# Patient Record
Sex: Female | Born: 1965
Health system: Southern US, Community
[De-identification: ages and names within clinical notes are randomized; demographics above are authoritative.]

## PROBLEM LIST (undated history)

## (undated) DIAGNOSIS — C50919 Malignant neoplasm of unspecified site of unspecified female breast: Secondary | ICD-10-CM

## (undated) DIAGNOSIS — N939 Abnormal uterine and vaginal bleeding, unspecified: Secondary | ICD-10-CM

## (undated) DIAGNOSIS — F419 Anxiety disorder, unspecified: Secondary | ICD-10-CM

## (undated) DIAGNOSIS — J449 Chronic obstructive pulmonary disease, unspecified: Secondary | ICD-10-CM

## (undated) DIAGNOSIS — D649 Anemia, unspecified: Secondary | ICD-10-CM

## (undated) HISTORY — DX: Abnormal uterine and vaginal bleeding, unspecified: N93.9

## (undated) HISTORY — DX: Anxiety disorder, unspecified: F41.9

## (undated) HISTORY — DX: Malignant neoplasm of unspecified site of unspecified female breast: C50.919

## (undated) HISTORY — PX: ABLATION: SHX5711

## (undated) HISTORY — DX: Chronic obstructive pulmonary disease, unspecified: J44.9

## (undated) HISTORY — DX: Anemia, unspecified: D64.9

---

## 1996-05-16 DIAGNOSIS — C50919 Malignant neoplasm of unspecified site of unspecified female breast: Secondary | ICD-10-CM

## 1996-05-16 HISTORY — DX: Malignant neoplasm of unspecified site of unspecified female breast: C50.919

## 1996-05-16 HISTORY — PX: BREAST LUMPECTOMY: SHX2

## 1998-01-26 ENCOUNTER — Other Ambulatory Visit: Admission: RE | Admit: 1998-01-26 | Discharge: 1998-01-26 | Payer: Self-pay | Admitting: Obstetrics and Gynecology

## 1999-02-05 ENCOUNTER — Other Ambulatory Visit: Admission: RE | Admit: 1999-02-05 | Discharge: 1999-02-05 | Payer: Self-pay | Admitting: Obstetrics and Gynecology

## 1999-03-11 ENCOUNTER — Other Ambulatory Visit: Admission: RE | Admit: 1999-03-11 | Discharge: 1999-03-11 | Payer: Self-pay | Admitting: Obstetrics and Gynecology

## 1999-06-18 ENCOUNTER — Other Ambulatory Visit: Admission: RE | Admit: 1999-06-18 | Discharge: 1999-06-18 | Payer: Self-pay | Admitting: Obstetrics and Gynecology

## 1999-07-02 ENCOUNTER — Emergency Department (HOSPITAL_COMMUNITY): Admission: EM | Admit: 1999-07-02 | Discharge: 1999-07-02 | Payer: Self-pay | Admitting: Emergency Medicine

## 1999-07-03 ENCOUNTER — Encounter: Payer: Self-pay | Admitting: Emergency Medicine

## 1999-07-05 ENCOUNTER — Emergency Department (HOSPITAL_COMMUNITY): Admission: EM | Admit: 1999-07-05 | Discharge: 1999-07-05 | Payer: Self-pay | Admitting: Emergency Medicine

## 1999-07-23 ENCOUNTER — Other Ambulatory Visit: Admission: RE | Admit: 1999-07-23 | Discharge: 1999-07-23 | Payer: Self-pay | Admitting: Obstetrics and Gynecology

## 1999-08-10 ENCOUNTER — Encounter: Admission: RE | Admit: 1999-08-10 | Discharge: 1999-08-10 | Payer: Self-pay | Admitting: Obstetrics and Gynecology

## 1999-08-10 ENCOUNTER — Encounter: Payer: Self-pay | Admitting: Obstetrics and Gynecology

## 1999-08-11 ENCOUNTER — Other Ambulatory Visit: Admission: RE | Admit: 1999-08-11 | Discharge: 1999-08-11 | Payer: Self-pay | Admitting: Obstetrics and Gynecology

## 1999-09-09 ENCOUNTER — Encounter: Payer: Self-pay | Admitting: Obstetrics and Gynecology

## 1999-09-09 ENCOUNTER — Encounter: Admission: RE | Admit: 1999-09-09 | Discharge: 1999-09-09 | Payer: Self-pay | Admitting: Obstetrics and Gynecology

## 2000-02-25 ENCOUNTER — Other Ambulatory Visit: Admission: RE | Admit: 2000-02-25 | Discharge: 2000-02-25 | Payer: Self-pay | Admitting: Obstetrics and Gynecology

## 2001-04-03 ENCOUNTER — Other Ambulatory Visit: Admission: RE | Admit: 2001-04-03 | Discharge: 2001-04-03 | Payer: Self-pay | Admitting: Obstetrics and Gynecology

## 2001-10-02 ENCOUNTER — Other Ambulatory Visit: Admission: RE | Admit: 2001-10-02 | Discharge: 2001-10-02 | Payer: Self-pay | Admitting: Obstetrics and Gynecology

## 2002-10-09 ENCOUNTER — Other Ambulatory Visit: Admission: RE | Admit: 2002-10-09 | Discharge: 2002-10-09 | Payer: Self-pay | Admitting: Obstetrics and Gynecology

## 2003-10-21 ENCOUNTER — Other Ambulatory Visit: Admission: RE | Admit: 2003-10-21 | Discharge: 2003-10-21 | Payer: Self-pay | Admitting: Obstetrics and Gynecology

## 2004-01-27 ENCOUNTER — Emergency Department (HOSPITAL_COMMUNITY): Admission: EM | Admit: 2004-01-27 | Discharge: 2004-01-27 | Payer: Self-pay | Admitting: *Deleted

## 2005-06-20 ENCOUNTER — Other Ambulatory Visit: Admission: RE | Admit: 2005-06-20 | Discharge: 2005-06-20 | Payer: Self-pay | Admitting: Obstetrics and Gynecology

## 2005-07-26 IMAGING — CR DG CHEST 2V
2 series · 2 of 2 positions shown · non-contrast
Comparison: None.

CLINICAL DATA: Chest pain and shortness of breath x 1 day.   History of breast cancer. 
 TWO VIEW CHEST ? 01/27/04

[view not recorded (1 of 2)]
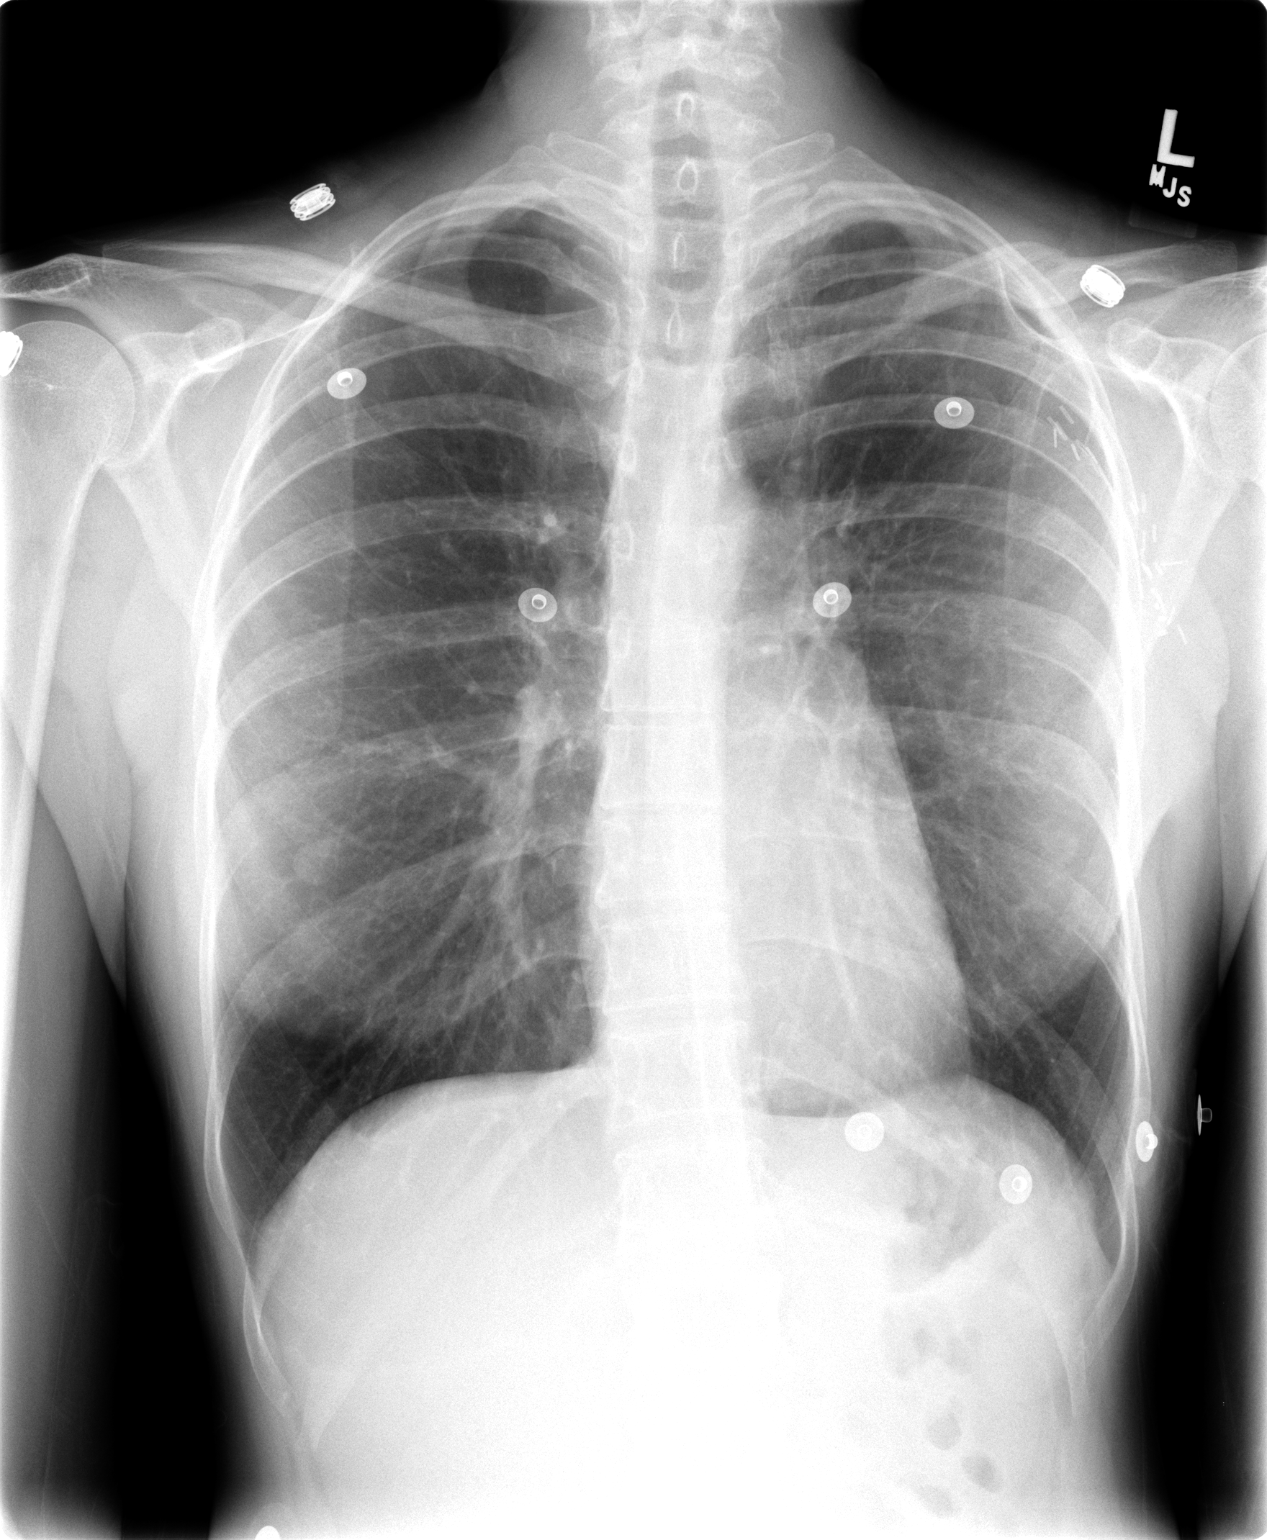

[view not recorded (2 of 2)]
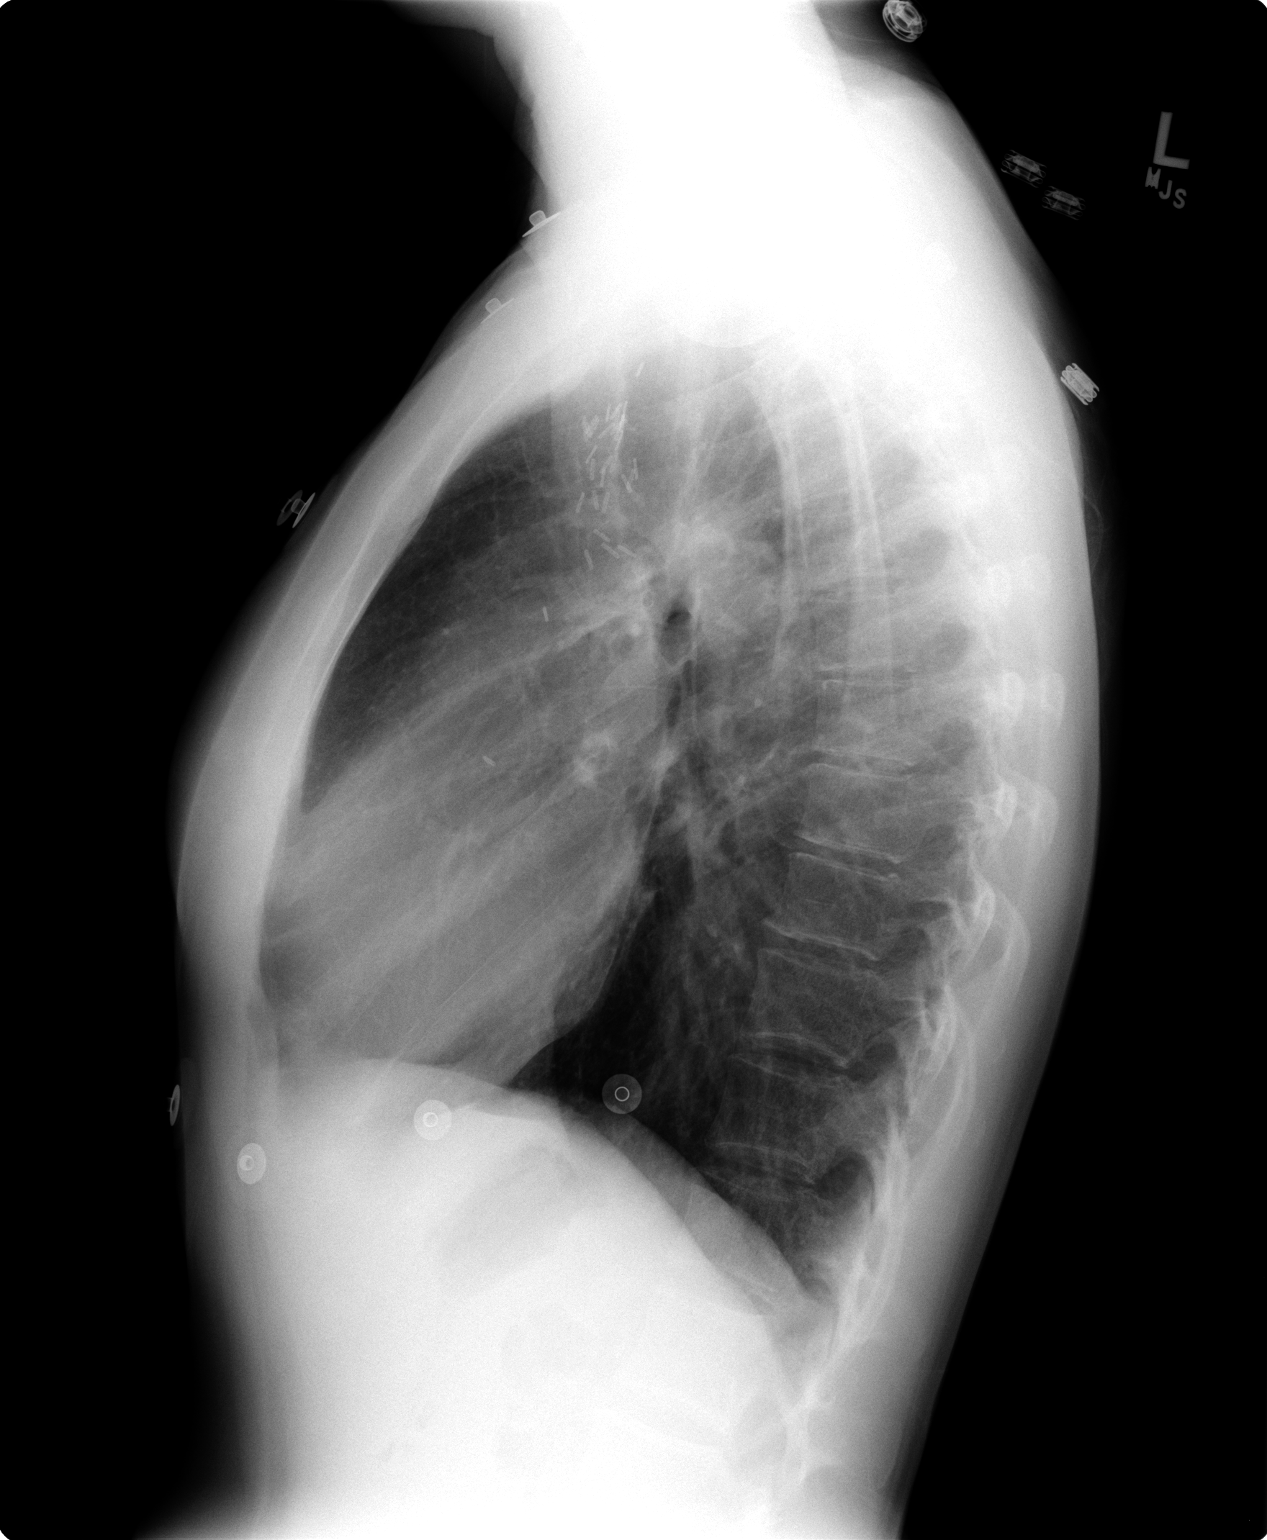

[2 of 2 positions shown; findings below may reference images not displayed]

FINDINGS: Two-view exam of the chest shows no focal consolidation, edema or effusion.  Single nodular densities project over the lung bases bilaterally.  The cardiopericardial silhouette is within normal limits for size.  A healed left clavicle fracture noted. Surgical clips are seen in the left axilla. 
 IMPRESSION 
 Single nodular densities project over each lung base.  These are probably nipple shadows, but a repeat frontal radiograph with nipple markers is recommended to confirm.

## 2012-09-10 DIAGNOSIS — Z853 Personal history of malignant neoplasm of breast: Secondary | ICD-10-CM | POA: Insufficient documentation

## 2012-09-13 DIAGNOSIS — N939 Abnormal uterine and vaginal bleeding, unspecified: Secondary | ICD-10-CM | POA: Insufficient documentation

## 2012-09-13 HISTORY — DX: Abnormal uterine and vaginal bleeding, unspecified: N93.9

## 2014-01-10 ENCOUNTER — Ambulatory Visit
Admission: RE | Admit: 2014-01-10 | Discharge: 2014-01-10 | Disposition: A | Discharge status: Home / Self Care | Payer: BC Managed Care – PPO | Source: Ambulatory Visit | Attending: Nurse Practitioner | Admitting: Nurse Practitioner

## 2014-01-10 ENCOUNTER — Other Ambulatory Visit: Payer: Self-pay | Admitting: Nurse Practitioner

## 2014-01-10 DIAGNOSIS — R0602 Shortness of breath: Secondary | ICD-10-CM

## 2015-07-10 IMAGING — CR DG CHEST 2V
2 series · 2 of 2 positions shown · non-contrast
Comparison: 07/10/2009

CLINICAL DATA: Chest pain, shortness of breath

EXAM:
CHEST  2 VIEW

[w chest pa]
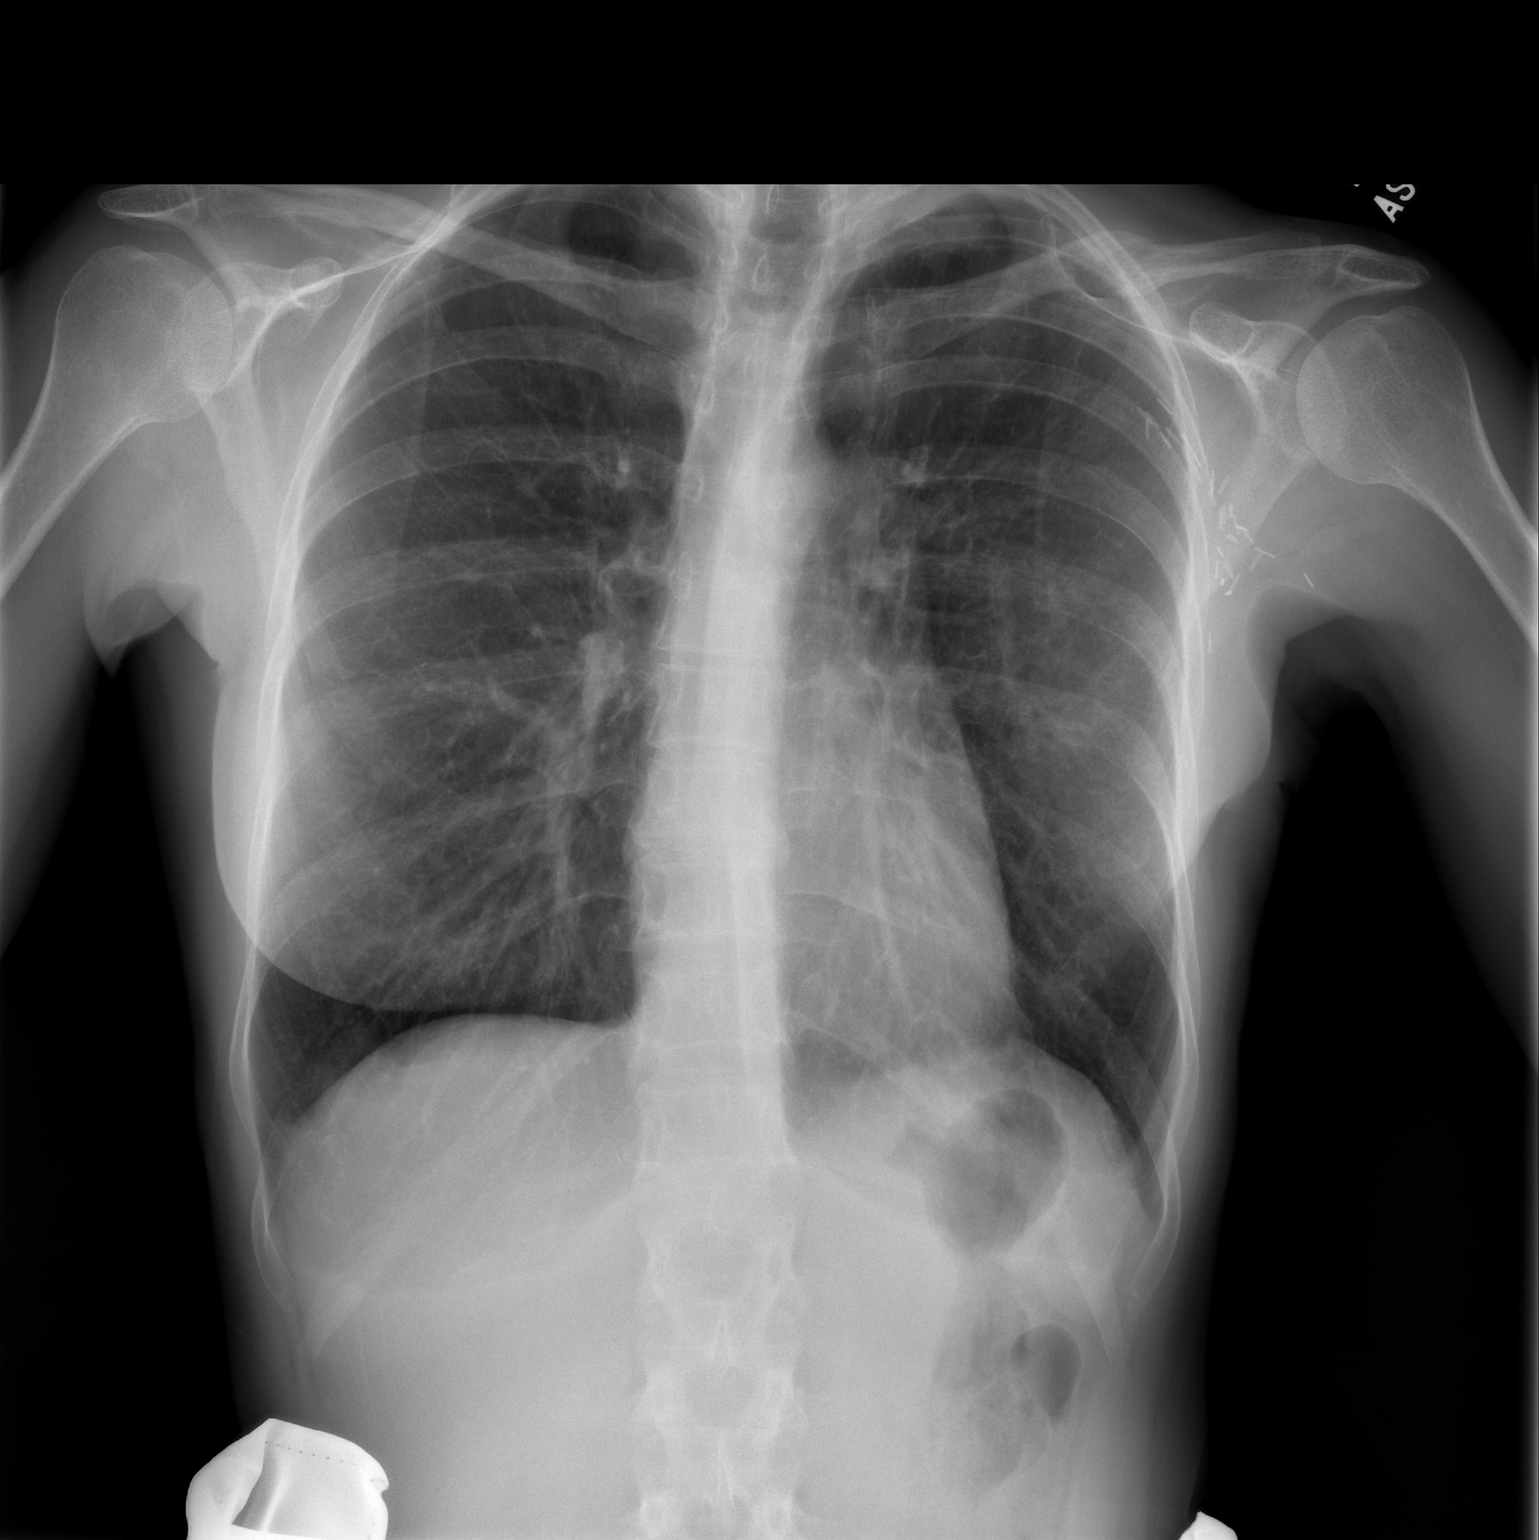

[w chest lat]
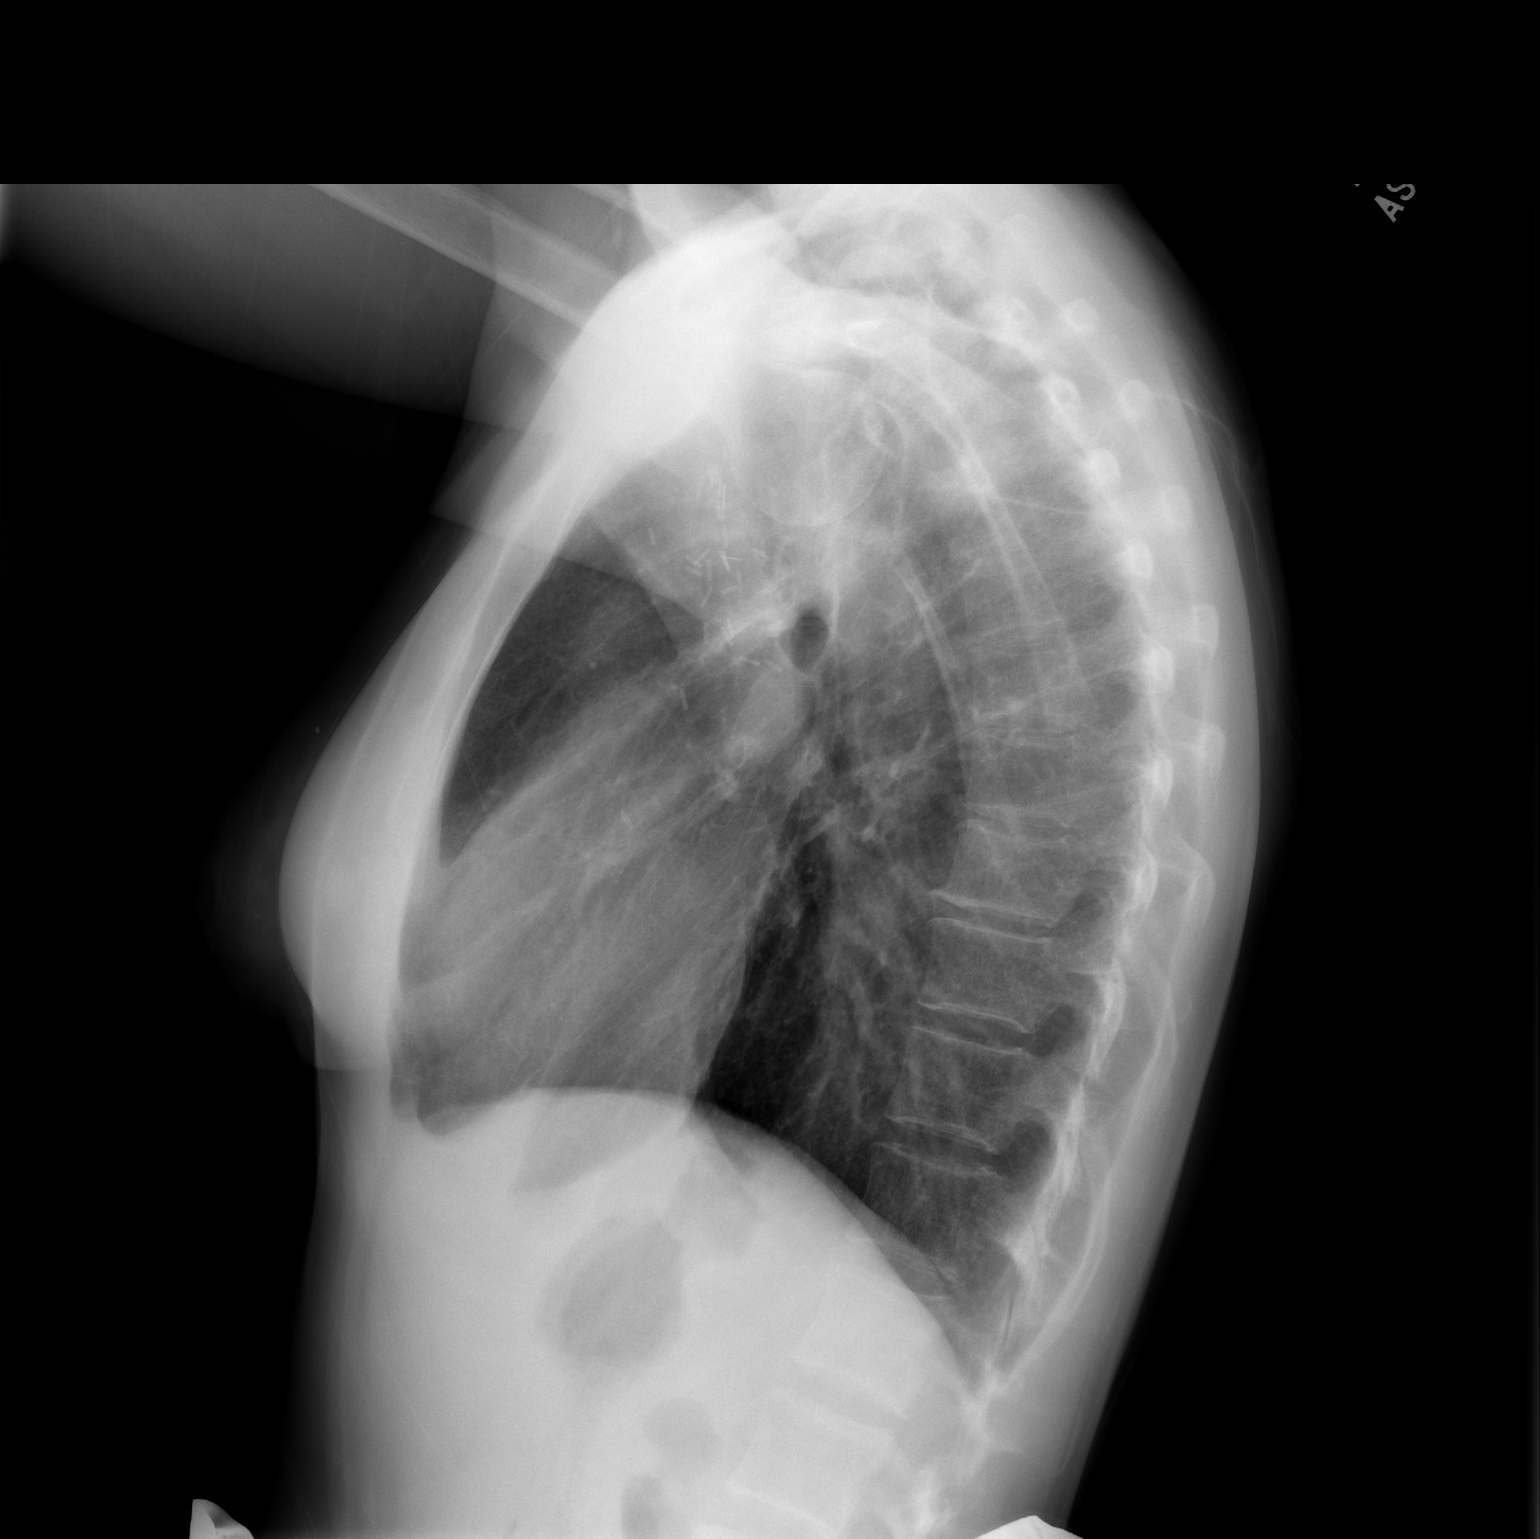

[2 of 2 positions shown; findings below may reference images not displayed]

FINDINGS: Lungs are clear.  No pleural effusion or pneumothorax.

The heart is normal in size.

Status post left mastectomy with left axillary lymph node
dissection.

Old left mid clavicular fracture.
IMPRESSION: No evidence of acute cardiopulmonary disease.

## 2017-08-16 ENCOUNTER — Encounter: Payer: Self-pay | Admitting: Gastroenterology

## 2017-08-23 ENCOUNTER — Encounter: Payer: Self-pay | Admitting: Physician Assistant

## 2017-08-23 ENCOUNTER — Other Ambulatory Visit: Payer: Self-pay

## 2017-08-23 ENCOUNTER — Ambulatory Visit (INDEPENDENT_AMBULATORY_CARE_PROVIDER_SITE_OTHER): Payer: BLUE CROSS/BLUE SHIELD | Admitting: Physician Assistant

## 2017-08-23 VITALS — BP 106/74 | HR 88 | Temp 97.7°F | Resp 16 | Ht 65.5 in | Wt 164.6 lb

## 2017-08-23 DIAGNOSIS — F17200 Nicotine dependence, unspecified, uncomplicated: Secondary | ICD-10-CM | POA: Insufficient documentation

## 2017-08-23 DIAGNOSIS — J019 Acute sinusitis, unspecified: Secondary | ICD-10-CM

## 2017-08-23 MED ORDER — AMOXICILLIN-POT CLAVULANATE 875-125 MG PO TABS: 1.0000 | ORAL_TABLET | Freq: Two times a day (BID) | ORAL | 0 refills | Status: AC

## 2017-08-23 MED ORDER — GUAIFENESIN ER 1200 MG PO TB12: 1.0000 | ORAL_TABLET | Freq: Two times a day (BID) | ORAL | 1 refills | Status: DC | PRN

## 2017-08-23 MED ORDER — BENZONATATE 100 MG PO CAPS: 100.0000 mg | ORAL_CAPSULE | Freq: Three times a day (TID) | ORAL | 0 refills | Status: DC | PRN

## 2017-08-23 MED ORDER — AZELASTINE HCL 0.1 % NA SOLN: 2.0000 | Freq: Two times a day (BID) | NASAL | 0 refills | Status: DC

## 2017-08-23 MED ORDER — PREDNISONE 20 MG PO TABS: ORAL_TABLET | ORAL | 0 refills | Status: DC

## 2017-08-23 NOTE — Patient Instructions (Addendum)
IF you received an x-ray today, you will receive an invoice from [facility]. Please contact [facility] at [phone] with questions or concerns regarding your invoice.   IF you received labwork today, you will receive an invoice from [facility]. Please contact [facility] at [phone] with questions or concerns regarding your invoice.   Our billing staff will not be able to assist you with questions regarding bills from these companies.  You will be contacted with the lab results as soon as they are available. The fastest way to get your results is to activate your My Chart account. Instructions are located on the last page of this paperwork. If you have not heard from [place] regarding the results in 2 weeks, please contact this office.    Did you know that you begin to benefit from quitting smoking within the first twenty minutes? It's TRUE.  At 20 minutes: -blood pressure decreases -pulse rate drops -body temperature of hands and feet increases  At 8 hours: -carbon monoxide level in blood drops to normal -oxygen level in blood increases to normal  At 24 hours: -the chance of heart attack decreases  At 48 hours: -nerve endings start regrowing -ability to smell and taste is enhanced  2 weeks-3 months: -circulation improves -walking becomes easier -lung function improves  1-9 months: -coughing, sinus congestion, fatigue and shortness of breath decreases  1 year: -excess risk of heart disease is decreased to HALF that of a smoker  5 years: Stroke risk is reduced to that of people who have never smoked  10 years: -risk of lung cancer drops to as little as half that of continuing smokers -risk of cancer of the mouth, throat, esophagus, bladder, kidney and pancreas decreases -risk of ulcer decreases  15 years -risk of heart disease is now similar to that of people who have never smoked -risk of death returns to nearly the level of people who have  never smoked    Sinusitis, Adult Sinusitis is soreness and inflammation of your sinuses. Sinuses are hollow spaces in the bones around your face. They are located:  Around your eyes.  In the middle of your forehead.  Behind your nose.  In your cheekbones.  Your sinuses and nasal passages are lined with a stringy fluid (mucus). Mucus normally drains out of your sinuses. When your nasal tissues get inflamed or swollen, the mucus can get trapped or blocked so air cannot flow through your sinuses. This lets bacteria, viruses, and funguses grow, and that leads to infection. Follow these instructions at home: Medicines  Take, use, or apply over-the-counter and prescription medicines only as told by your doctor. These may include nasal sprays.  If you were prescribed an antibiotic medicine, take it as told by your doctor. Do not stop taking the antibiotic even if you start to feel better. Hydrate and Humidify  Drink enough water to keep your pee (urine) clear or pale yellow.  Use a cool mist humidifier to keep the humidity level in your home above 50%.  Breathe in steam for 10-15 minutes, 3-4 times a day or as told by your doctor. You can do this in the bathroom while a hot shower is running.  Try not to spend time in cool or dry air. Rest  Rest as much as possible.  Sleep with your head raised (elevated).  Make sure to get enough sleep each night. General instructions  Put a warm, moist washcloth on your face 3-4 times a day or  as told by your doctor. This will help with discomfort.  Wash your hands often with soap and water. If there is no soap and water, use hand sanitizer.  Do not smoke. Avoid being around people who are smoking (secondhand smoke).  Keep all follow-up visits as told by your doctor. This is important. Contact a doctor if:  You have a fever.  Your symptoms get worse.  Your symptoms do not get better within 10 days. Get help right away if:  You have  a very bad headache.  You cannot stop throwing up (vomiting).  You have pain or swelling around your face or eyes.  You have trouble seeing.  You feel confused.  Your neck is stiff.  You have trouble breathing. This information is not intended to replace advice given to you by your health care provider. Make sure you discuss any questions you have with your health care provider. Document Released: 10/19/2007 Document Revised: 12/27/2015 Document Reviewed: 02/25/2015 Elsevier Interactive Patient Education  [employer].

## 2017-08-23 NOTE — Assessment & Plan Note (Signed)
Continue efforts toward smoking cessation. 

## 2017-08-23 NOTE — Progress Notes (Signed)
Patient ID: Olivia Reid, female     DOB: 07-30-1965, 52 y.o.    MRN: [id number]  PCP: Patient, No Pcp Per  Chief Complaint  Patient presents with  . Establish Care    Subjective:   This patient is new to this practice and presents for evaluation of sinusitis.  Symptoms began about 3 weeks ago. Headache, sinus pressure, nasal congestion and drainage, sore throat, nausea without vomiting, diarrhea without melena or hematochezia.  She describes feeling hot and clammy, lightheaded/faint.  She presented with these symptoms to the clinic at work, where she was prescribed amoxicillin 250 mg BID, but reports that she wasn't told what the diagnosis was. She has not had any improvement. Acetaminophen and ibuprofen do not help.  She is working on smoking cessation, cutting back.  Review of Systems As above.  Prior to Admission medications   Medication Sig Start Date End Date Taking? Authorizing Provider  amoxicillin (AMOXIL) 250 MG capsule Take 250 mg by mouth 2 (two) times daily.   Yes [provider]  clonazePAM (KLONOPIN) 0.5 MG tablet Take 0.5 mg by mouth 2 (two) times daily as needed for anxiety.   Yes [provider]  Multiple Vitamin (MULTI-VITAMINS) TABS Take by mouth.   yes [provider]     Allergies  Allergen Reactions  . Cefadroxil Other (See Comments)    Other Reaction: Other reaction  . Codeine Other (See Comments)    Other Reaction: Other reaction  . Sulfa Antibiotics Other (See Comments)    Other Reaction: Swollen tongue  . Cephalosporins     Other reaction(s): Unknown     Patient Active Problem List   Diagnosis Date Noted  . History of breast cancer 09/10/2012     Family History  Problem Relation Age of Onset  . Mental illness Mother        history of nervous breakdown  . Heart disease Father   . Healthy Brother      Social History   Socioeconomic History  . Marital status: Married    Spouse name: Karma Ansley  . Number of children: 0  . Years of education: Not on file  . Highest education level: 10th grade  Occupational History  . Occupation: Economist: OTHER    Comment: [place]  . Financial resource strain: Not on file  . Food insecurity:    Worry: Not on file    Inability: Not on file  . Transportation needs:    Medical: Not on file    Non-medical: Not on file  Tobacco Use  . Smoking status: Current Every Day Smoker    Packs/day: 0.50    Types: Cigars  . Smokeless tobacco: Never Used  . Tobacco comment: cutting back  Substance and Sexual Activity  . Alcohol use: Never    Frequency: Never  . Drug use: Never  . Sexual activity: Not on file  Lifestyle  . Physical activity:    Days per week: Not on file    Minutes per session: Not on file  . Stress: Not on file  Relationships  . Social connections:    Talks on phone: Not on file    Gets together: Not on file    Attends religious service: Not on file    Active member of club or organization: Not on file    Attends meetings of clubs or organizations: Not on file    Relationship status:  Not on file  . Intimate partner violence:    Fear of current or ex partner: Not on file    Emotionally abused: Not on file    Physically abused: Not on file    Forced sexual activity: Not on file  Other Topics Concern  . Not on file  Social History Narrative   Lives with her daughter (not biologically related), son-in-law and their children.   Separated from her husband.         Objective:  Physical Exam  Constitutional: She is oriented to person, place, and time. She appears well-developed and well-nourished. No distress.  BP 106/74   Pulse 88   Temp 97.7 F (36.5 C)   Resp 16   Ht 5' 5.5" (1.664 m)   Wt 164 lb 9.6 oz (74.7 kg)   SpO2 96%   BMI 26.97 kg/m    HENT:  Head: Normocephalic and atraumatic.  Right Ear: Hearing, tympanic membrane, external ear and ear canal normal.  Left  Ear: Hearing, tympanic membrane, external ear and ear canal normal.  Nose: Mucosal edema and rhinorrhea present.  No foreign bodies. Right sinus exhibits maxillary sinus tenderness and frontal sinus tenderness. Left sinus exhibits maxillary sinus tenderness and frontal sinus tenderness.  Mouth/Throat: Uvula is midline, oropharynx is clear and moist and mucous membranes are normal. No uvula swelling. No oropharyngeal exudate.  Eyes: Pupils are equal, round, and reactive to light. Conjunctivae and EOM are normal. Right eye exhibits no discharge. Left eye exhibits no discharge. No scleral icterus.  Neck: Trachea normal, normal range of motion and full passive range of motion without pain. Neck supple. No thyroid mass and no thyromegaly present.  Cardiovascular: Normal rate, regular rhythm and normal heart sounds.  Pulmonary/Chest: Effort normal and breath sounds normal.  Lymphadenopathy:       Head (right side): No submandibular, no tonsillar, no preauricular, no posterior auricular and no occipital adenopathy present.       Head (left side): No submandibular, no tonsillar, no preauricular and no occipital adenopathy present.    She has no cervical adenopathy.       Right: No supraclavicular adenopathy present.       Left: No supraclavicular adenopathy present.  Neurological: She is alert and oriented to person, place, and time. She has normal strength. No cranial nerve deficit or sensory deficit.  Skin: Skin is warm, dry and intact. No rash noted.  Psychiatric: She has a normal mood and affect. Her speech is normal and behavior is normal.       Assessment & Plan:   Problem List Items Addressed This Visit    Smoker    Continue efforts toward smoking cessation.       Other Visit Diagnoses    Acute non-recurrent sinusitis, unspecified location    -  Primary   Stop Amoxicillin. Start Augmentin. Add prednisone, benzonatate, azelastins, guaifenisen. Rest. Hydrate. Smoking cessation.   Relevant  Medications   benzonatate (TESSALON) 100 MG capsule   Guaifenesin (MUCINEX MAXIMUM STRENGTH) 1200 MG TB12   azelastine (ASTELIN) 0.1 % nasal spray   amoxicillin-clavulanate (AUGMENTIN) 875-125 MG tablet   predniSONE (DELTASONE) 20 MG tablet       Return if symptoms worsen or fail to improve.   Fara Chute, PA-C [facility]

## 2017-08-25 ENCOUNTER — Telehealth: Payer: Self-pay | Admitting: Physician Assistant

## 2017-08-25 NOTE — Telephone Encounter (Signed)
Phone call to patient. Relayed message from , patient verbalizes understanding. She will try taking a benadryl, and if that doesn't work try 40mg  of the prednisone tomorrow night. If both are unsuccessful with helping her sleep, she will discontinue medication.   Patient is also requesting return work note be extended with return on Monday, 08/28/17. Provider, ok to write note for patient?

## 2017-08-25 NOTE — Telephone Encounter (Unsigned)
Copied from [place] [id number]. Topic: Quick Communication - Rx Refill/Question >> Aug 25, 2017 12:54 PM Neva Seat wrote: predniSONE (DELTASONE) 20 MG tablet  Pt did take Rx in the morning as requested.  It still kept her up until 5 am. Pt needing to know what needs to be done to help her sleep while taking the Rx.

## 2017-08-25 NOTE — Telephone Encounter (Signed)
Provider, please advise.  

## 2017-08-25 NOTE — Telephone Encounter (Signed)
I recommend stopping the prednisone. I would expect her to improve without it, but had hope we could expedite things with the prednisone. If she really wants to take it, she can take Benadryl at bedtime, or REDUCE the dose to 40 mg (two of the tablets), but if that doesn't work, she should not take it.

## 2017-08-25 NOTE — Telephone Encounter (Unsigned)
Copied from [place] [id number]. Topic: Quick Communication - See Telephone Encounter >> Aug 25, 2017 12:52 PM Neva Seat wrote: Pt is needing new absent note faxed to HR  because she cannot return to work until [employer].  She was still sick today - Fri 08-25-17. FAX [phone]

## 2017-08-28 NOTE — Telephone Encounter (Signed)
Yes, OK for work note.

## 2017-08-28 NOTE — Telephone Encounter (Signed)
Phone call to patient. Unable to reach. If patient calls back, please inform her that work note has been updated for return to work 08/28/17. A copy has been left at the front desk for her at the [employer] building for pickup.

## 2017-09-06 ENCOUNTER — Ambulatory Visit (AMBULATORY_SURGERY_CENTER): Payer: Self-pay | Admitting: *Deleted

## 2017-09-06 ENCOUNTER — Other Ambulatory Visit: Payer: Self-pay

## 2017-09-06 VITALS — Ht 65.0 in | Wt 165.4 lb

## 2017-09-06 DIAGNOSIS — Z1211 Encounter for screening for malignant neoplasm of colon: Secondary | ICD-10-CM

## 2017-09-06 MED ORDER — NA SULFATE-K SULFATE-MG SULF 17.5-3.13-1.6 GM/177ML PO SOLN: 1.0000 | Freq: Once | ORAL | 0 refills | Status: AC

## 2017-09-06 NOTE — Progress Notes (Signed)
Denies allergies to eggs or soy products. Denies complications with sedation or anesthesia. Denies O2 use. Denies use of diet or weight loss medications.  Emmi instructions given for colonoscopy.  

## 2017-09-13 ENCOUNTER — Encounter: Payer: Self-pay | Admitting: Gastroenterology

## 2017-09-19 ENCOUNTER — Other Ambulatory Visit: Payer: Self-pay | Admitting: Physician Assistant

## 2017-09-19 DIAGNOSIS — J019 Acute sinusitis, unspecified: Secondary | ICD-10-CM

## 2017-11-10 ENCOUNTER — Encounter: Payer: Self-pay | Admitting: Gastroenterology

## 2017-11-24 ENCOUNTER — Ambulatory Visit (AMBULATORY_SURGERY_CENTER): Payer: BLUE CROSS/BLUE SHIELD | Admitting: Gastroenterology

## 2017-11-24 ENCOUNTER — Encounter: Payer: Self-pay | Admitting: Gastroenterology

## 2017-11-24 VITALS — BP 125/64 | HR 70 | Temp 97.8°F | Resp 16 | Ht 65.5 in | Wt 164.0 lb

## 2017-11-24 DIAGNOSIS — K635 Polyp of colon: Secondary | ICD-10-CM

## 2017-11-24 DIAGNOSIS — D127 Benign neoplasm of rectosigmoid junction: Secondary | ICD-10-CM

## 2017-11-24 DIAGNOSIS — D129 Benign neoplasm of anus and anal canal: Secondary | ICD-10-CM

## 2017-11-24 DIAGNOSIS — K621 Rectal polyp: Secondary | ICD-10-CM

## 2017-11-24 DIAGNOSIS — D128 Benign neoplasm of rectum: Secondary | ICD-10-CM

## 2017-11-24 DIAGNOSIS — Z1211 Encounter for screening for malignant neoplasm of colon: Secondary | ICD-10-CM | POA: Diagnosis not present

## 2017-11-24 DIAGNOSIS — D125 Benign neoplasm of sigmoid colon: Secondary | ICD-10-CM

## 2017-11-24 MED ORDER — SODIUM CHLORIDE 0.9 % IV SOLN: 500.0000 mL | Freq: Once | INTRAVENOUS | Status: AC

## 2017-11-24 NOTE — Progress Notes (Signed)
Report to PACU, RN, vss, BBS= Clear.  

## 2017-11-24 NOTE — Progress Notes (Signed)
Called to room to assist during endoscopic procedure.  Patient ID and intended procedure confirmed with present staff. Received instructions for my participation in the procedure from the performing physician.  

## 2017-11-24 NOTE — Patient Instructions (Signed)
Please read handouts on polyps. Continue present medications.   YOU HAD AN ENDOSCOPIC PROCEDURE TODAY AT [facility]:   Refer to the procedure report that was given to you for any specific questions about what was found during the examination.  If the procedure report does not answer your questions, please call your gastroenterologist to clarify.  If you requested that your care partner not be given the details of your procedure findings, then the procedure report has been included in a sealed envelope for you to review at your convenience later.  YOU SHOULD EXPECT: Some feelings of bloating in the abdomen. Passage of more gas than usual.  Walking can help get rid of the air that was put into your GI tract during the procedure and reduce the bloating. If you had a lower endoscopy (such as a colonoscopy or flexible sigmoidoscopy) you may notice spotting of blood in your stool or on the toilet paper. If you underwent a bowel prep for your procedure, you may not have a normal bowel movement for a few days.  Please Note:  You might notice some irritation and congestion in your nose or some drainage.  This is from the oxygen used during your procedure.  There is no need for concern and it should clear up in a day or so.  SYMPTOMS TO REPORT IMMEDIATELY:   Following lower endoscopy (colonoscopy or flexible sigmoidoscopy):  Excessive amounts of blood in the stool  Significant tenderness or worsening of abdominal pains  Swelling of the abdomen that is new, acute  Fever of 100F or higher    For urgent or emergent issues, a gastroenterologist can be reached at any hour by calling [phone].   DIET:  We do recommend a small meal at first, but then you may proceed to your regular diet.  Drink plenty of fluids but you should avoid alcoholic beverages for 24 hours.  ACTIVITY:  You should plan to take it easy for the rest of today and you should NOT DRIVE or use heavy machinery until  tomorrow (because of the sedation medicines used during the test).    FOLLOW UP: Our staff will call the number listed on your records the next business day following your procedure to check on you and address any questions or concerns that you may have regarding the information given to you following your procedure. If we do not reach you, we will leave a message.  However, if you are feeling well and you are not experiencing any problems, there is no need to return our call.  We will assume that you have returned to your regular daily activities without incident.  If any biopsies were taken you will be contacted by phone or by letter within the next 1-3 weeks.  Please call us at [phone] if you have not heard about the biopsies in 3 weeks.    SIGNATURES/CONFIDENTIALITY: You and/or your care partner have signed paperwork which will be entered into your electronic medical record.  These signatures attest to the fact that that the information above on your After Visit Summary has been reviewed and is understood.  Full responsibility of the confidentiality of this discharge information lies with you and/or your care-partner.

## 2017-11-24 NOTE — Progress Notes (Signed)
Pt's states no medical or surgical changes since previsit or office visit. 

## 2017-11-24 NOTE — Op Note (Signed)
[place] Patient Name: Olivia Reid Procedure Date: 11/24/2017 11:15 AM MRN: [id number] Endoscopist: Ladene Artist , MD Age: 52 Referring MD:  Date of Birth: 1965/05/29 Gender: Female Account #: [id number] Procedure:                Colonoscopy Indications:              Screening for colorectal malignant neoplasm Medicines:                Monitored Anesthesia Care Procedure:                Pre-Anesthesia Assessment:                           - Prior to the procedure, a History and Physical                            was performed, and patient medications and                            allergies were reviewed. The patient's tolerance of                            previous anesthesia was also reviewed. The risks                            and benefits of the procedure and the sedation                            options and risks were discussed with the patient.                            All questions were answered, and informed consent                            was obtained. Prior Anticoagulants: The patient has                            taken no previous anticoagulant or antiplatelet                            agents. ASA Grade Assessment: II - A patient with                            mild systemic disease. After reviewing the risks                            and benefits, the patient was deemed in                            satisfactory condition to undergo the procedure.                           After obtaining informed consent, the colonoscope  was passed under direct vision. Throughout the                            procedure, the patient's blood pressure, pulse, and                            oxygen saturations were monitored continuously. The                            Model PCF-H190DL [id number]) scope was introduced                            through the anus and advanced to the the cecum,                            identified by  appendiceal orifice and ileocecal                            valve. The ileocecal valve, appendiceal orifice,                            and rectum were photographed. The quality of the                            bowel preparation was good. The colonoscopy was                            performed without difficulty. The patient tolerated                            the procedure well. Scope In: 11:22:53 AM Scope Out: 11:36:15 AM Scope Withdrawal Time: 0 hours 11 minutes 8 seconds  Total Procedure Duration: 0 hours 13 minutes 22 seconds  Findings:                 The perianal and digital rectal examinations were                            normal.                           A 7 mm polyp was found in the sigmoid colon. The                            polyp was sessile. The polyp was removed with a                            cold snare. Resection and retrieval were complete.                           A 4 mm polyp was found in the rectum. The polyp was                            sessile. The polyp was removed with  a cold biopsy                            forceps. Resection and retrieval were complete.                           The exam was otherwise without abnormality on                            direct and retroflexion views. Complications:            No immediate complications. Estimated blood loss:                            None. Estimated Blood Loss:     Estimated blood loss: none. Impression:               - One 7 mm polyp in the sigmoid colon, removed with                            a cold snare. Resected and retrieved.                           - One 4 mm polyp in the rectum, removed with a cold                            biopsy forceps. Resected and retrieved.                           - The examination was otherwise normal on direct                            and retroflexion views. Recommendation:           - Repeat colonoscopy in 5 years for surveillance if                             polyp(s) are precancerous, otherwise 10 years.                           - Patient has a contact number available for                            emergencies. The signs and symptoms of potential                            delayed complications were discussed with the                            patient. Return to normal activities tomorrow.                            Written discharge instructions were provided to the                            patient.                           -  Resume previous diet.                           - Continue present medications.                           - Await pathology results. Ladene Artist, MD 11/24/2017 11:43:01 AM This report has been signed electronically.

## 2017-11-27 ENCOUNTER — Telehealth: Payer: Self-pay | Admitting: *Deleted

## 2017-11-27 NOTE — Telephone Encounter (Signed)
  Follow up Call-  Call back number 11/24/2017  Post procedure Call Back phone  # [phone]  Permission to leave phone message Yes  Some recent data might be hidden     Patient questions:  Do you have a fever, pain , or abdominal swelling? No. Pain Score  0 *  Have you tolerated food without any problems? Yes.    Have you been able to return to your normal activities? Yes.    Do you have any questions about your discharge instructions: Diet   No. Medications  No. Follow up visit  No.  Do you have questions or concerns about your Care? No.  Actions: * If pain score is 4 or above: No action needed, pain <4.

## 2017-12-05 ENCOUNTER — Encounter: Payer: Self-pay | Admitting: Gastroenterology

## 2022-08-25 ENCOUNTER — Ambulatory Visit: Payer: BLUE CROSS/BLUE SHIELD | Admitting: Rehabilitation

## 2022-08-30 ENCOUNTER — Ambulatory Visit: Payer: BLUE CROSS/BLUE SHIELD | Admitting: Physical Therapy
